# Patient Record
Sex: Male | Born: 1965 | Race: Black or African American | Hispanic: No | Smoking: Former smoker
Health system: Southern US, Community
[De-identification: ages and names within clinical notes are randomized; demographics above are authoritative.]

---

## 1998-10-10 ENCOUNTER — Emergency Department (HOSPITAL_COMMUNITY): Admission: EM | Admit: 1998-10-10 | Discharge: 1998-10-10 | Payer: Self-pay | Admitting: Emergency Medicine

## 2002-02-12 ENCOUNTER — Emergency Department (HOSPITAL_COMMUNITY): Admission: EM | Admit: 2002-02-12 | Discharge: 2002-02-12 | Payer: Self-pay | Admitting: Emergency Medicine

## 2002-05-10 ENCOUNTER — Emergency Department (HOSPITAL_COMMUNITY): Admission: EM | Admit: 2002-05-10 | Discharge: 2002-05-10 | Payer: Self-pay | Admitting: Emergency Medicine

## 2003-02-09 ENCOUNTER — Emergency Department (HOSPITAL_COMMUNITY): Admission: EM | Admit: 2003-02-09 | Discharge: 2003-02-09 | Payer: Self-pay | Admitting: Emergency Medicine

## 2004-03-15 ENCOUNTER — Emergency Department (HOSPITAL_COMMUNITY): Admission: EM | Admit: 2004-03-15 | Discharge: 2004-03-15 | Payer: Self-pay | Admitting: Family Medicine

## 2004-06-11 ENCOUNTER — Emergency Department (HOSPITAL_COMMUNITY): Admission: EM | Admit: 2004-06-11 | Discharge: 2004-06-11 | Payer: Self-pay | Admitting: Emergency Medicine

## 2006-12-03 ENCOUNTER — Emergency Department (HOSPITAL_COMMUNITY): Admission: EM | Admit: 2006-12-03 | Discharge: 2006-12-03 | Payer: Self-pay | Admitting: Emergency Medicine

## 2009-03-11 ENCOUNTER — Emergency Department (HOSPITAL_COMMUNITY): Admission: EM | Admit: 2009-03-11 | Discharge: 2009-03-11 | Payer: Self-pay | Admitting: Emergency Medicine

## 2010-09-07 ENCOUNTER — Emergency Department (HOSPITAL_COMMUNITY)
Admission: EM | Admit: 2010-09-07 | Discharge: 2010-09-07 | Disposition: A | Discharge status: Home / Self Care | Payer: 59 | Attending: Emergency Medicine | Admitting: Emergency Medicine

## 2010-09-07 ENCOUNTER — Emergency Department (HOSPITAL_COMMUNITY): Payer: 59

## 2010-09-07 DIAGNOSIS — R05 Cough: Secondary | ICD-10-CM | POA: Insufficient documentation

## 2010-09-07 DIAGNOSIS — R6889 Other general symptoms and signs: Secondary | ICD-10-CM | POA: Insufficient documentation

## 2010-09-07 DIAGNOSIS — R6883 Chills (without fever): Secondary | ICD-10-CM | POA: Insufficient documentation

## 2010-09-07 DIAGNOSIS — B9789 Other viral agents as the cause of diseases classified elsewhere: Secondary | ICD-10-CM | POA: Insufficient documentation

## 2010-09-07 DIAGNOSIS — R070 Pain in throat: Secondary | ICD-10-CM | POA: Insufficient documentation

## 2010-09-07 DIAGNOSIS — R0602 Shortness of breath: Secondary | ICD-10-CM | POA: Insufficient documentation

## 2010-09-07 DIAGNOSIS — R059 Cough, unspecified: Secondary | ICD-10-CM | POA: Insufficient documentation

## 2010-10-23 ENCOUNTER — Emergency Department (HOSPITAL_COMMUNITY): Payer: Self-pay

## 2010-10-23 ENCOUNTER — Emergency Department (HOSPITAL_COMMUNITY)
Admission: EM | Admit: 2010-10-23 | Discharge: 2010-10-23 | Disposition: A | Discharge status: Home / Self Care | Payer: Self-pay | Attending: Emergency Medicine | Admitting: Emergency Medicine

## 2010-10-23 DIAGNOSIS — K274 Chronic or unspecified peptic ulcer, site unspecified, with hemorrhage: Secondary | ICD-10-CM | POA: Insufficient documentation

## 2010-10-23 DIAGNOSIS — Z7982 Long term (current) use of aspirin: Secondary | ICD-10-CM | POA: Insufficient documentation

## 2010-10-23 DIAGNOSIS — R1012 Left upper quadrant pain: Secondary | ICD-10-CM | POA: Insufficient documentation

## 2010-10-23 DIAGNOSIS — K921 Melena: Secondary | ICD-10-CM | POA: Insufficient documentation

## 2010-10-23 LAB — POCT I-STAT, CHEM 8
Creatinine, Ser: 1.1 mg/dL (ref 0.50–1.35)
Potassium: 3.5 mEq/L (ref 3.5–5.1)
Sodium: 139 mEq/L (ref 135–145)
TCO2: 26 mmol/L (ref 0–100)

## 2010-10-23 LAB — URINALYSIS, ROUTINE W REFLEX MICROSCOPIC
Glucose, UA: NEGATIVE mg/dL
Hgb urine dipstick: NEGATIVE
Nitrite: NEGATIVE
Urobilinogen, UA: 0.2 mg/dL (ref 0.0–1.0)

## 2010-10-23 LAB — URINE MICROSCOPIC-ADD ON

## 2010-10-23 LAB — CBC
MCH: 32.4 pg (ref 26.0–34.0)
MCHC: 35.4 g/dL (ref 30.0–36.0)
Platelets: 252 10*3/uL (ref 150–400)
RBC: 4.79 MIL/uL (ref 4.22–5.81)

## 2010-10-23 LAB — COMPREHENSIVE METABOLIC PANEL
BUN: 9 mg/dL (ref 6–23)
CO2: 29 mEq/L (ref 19–32)
Creatinine, Ser: 1 mg/dL (ref 0.50–1.35)
Glucose, Bld: 92 mg/dL (ref 70–99)
Total Protein: 8.4 g/dL — ABNORMAL HIGH (ref 6.0–8.3)

## 2010-10-23 LAB — APTT: aPTT: 34 seconds (ref 24–37)

## 2010-10-23 LAB — PROTIME-INR: INR: 1.06 (ref 0.00–1.49)

## 2010-10-23 LAB — DIFFERENTIAL
Eosinophils Relative: 2 % (ref 0–5)
Monocytes Absolute: 0.9 10*3/uL (ref 0.1–1.0)
Neutro Abs: 4.4 10*3/uL (ref 1.7–7.7)

## 2011-06-27 ENCOUNTER — Emergency Department (HOSPITAL_COMMUNITY)
Admission: EM | Admit: 2011-06-27 | Discharge: 2011-06-27 | Disposition: A | Discharge status: Home / Self Care | Payer: 59 | Source: Home / Self Care

## 2011-06-27 ENCOUNTER — Encounter (HOSPITAL_COMMUNITY): Payer: Self-pay | Admitting: *Deleted

## 2011-06-27 ENCOUNTER — Emergency Department (HOSPITAL_COMMUNITY): Payer: 59

## 2011-06-27 ENCOUNTER — Emergency Department (HOSPITAL_COMMUNITY)
Admission: EM | Admit: 2011-06-27 | Discharge: 2011-06-27 | Disposition: A | Discharge status: Home / Self Care | Payer: 59 | Attending: Physician Assistant | Admitting: Physician Assistant

## 2011-06-27 DIAGNOSIS — S63259A Unspecified dislocation of unspecified finger, initial encounter: Secondary | ICD-10-CM

## 2011-06-27 DIAGNOSIS — X500XXA Overexertion from strenuous movement or load, initial encounter: Secondary | ICD-10-CM | POA: Insufficient documentation

## 2011-06-27 DIAGNOSIS — F17200 Nicotine dependence, unspecified, uncomplicated: Secondary | ICD-10-CM | POA: Insufficient documentation

## 2011-06-27 MED ORDER — LIDOCAINE HCL 2 % IJ SOLN
10.0000 mL | Freq: Once | INTRAMUSCULAR | Status: AC
  Administered 2011-06-27: 200 mg via INTRADERMAL
  Filled 2011-06-27: qty 1

## 2011-06-27 MED ORDER — IBUPROFEN 800 MG PO TABS: 800.0000 mg | ORAL_TABLET | Freq: Three times a day (TID) | ORAL | Status: AC

## 2011-06-27 MED ORDER — OXYCODONE-ACETAMINOPHEN 5-325 MG PO TABS: 1.0000 | ORAL_TABLET | ORAL | Status: AC | PRN

## 2011-06-27 NOTE — Discharge Instructions (Signed)
Mr Slabach your finger was dislocated and we reset this in the ER.  Wear the finger splint until you feel better.  If not better in several days follow up with Dr Mina Marble listed below.  Take ibuprofen 800mg  every 6 hours for the next 24 hours with food.  Take the percocet for severe pain but do not drive or operate machinery with this.  ICe and elevate intermittantly x 24 hours.   Finger Dislocation A dislocated finger means that the bones of the finger joint are out of place. Most of the time, finger dislocations can be put back in place without having surgery. If the joint is very unstable or if the bone is broken (fractured), it may require surgery. After surgery, the finger should be kept in a splint for 1 to 2 weeks to protect the joint. Following this, the injured finger may be taped to the finger next to it for another 2 weeks until all the pain and swelling are gone. You should begin gentle motion exercises of the joint in 1 week to keep the joint from getting stiff. Keep your hand raised and use ice packs often for 2 to 3 days. This will lessen the pain and swelling. Follow-up care within 1 week is very important. SEEK MEDICAL CARE IF:  You develop increasing pain.   Your injured finger becomes numb, cold, or pale.  Document Released: 01/15/2005 Document Revised: 09/27/2010 Document Reviewed: 07/06/2006 [facility] Patient Information 2012 [place], [place].

## 2011-06-27 NOTE — ED Provider Notes (Signed)
History     CSN: [id number]  Arrival date & time 06/27/11  1706   First MD Initiated Contact with Patient 06/27/11 1805      Chief Complaint  Patient presents with  . Finger Injury    (Consider location/radiation/quality/duration/timing/severity/associated sxs/prior treatment) Patient is a 46 y.o. male presenting with hand pain. The history is provided by the patient and a relative. No language interpreter was used.  Hand Pain This is a new problem. The current episode started today. The problem occurs constantly. The problem has been unchanged. Pertinent negatives include no fever, nausea or vomiting. The symptoms are aggravated by bending. He has tried nothing for the symptoms.   Patient reports that he was turning around to speak to a coworker and his and jammed into the wall  dislocating his left index distal DIP joint dorsally per x-ray. Good CMS to finger.    History reviewed. No pertinent past medical history.  History reviewed. No pertinent past surgical history.  History reviewed. No pertinent family history.  History  Substance Use Topics  . Smoking status: Current Everyday Smoker    Types: Cigarettes  . Smokeless tobacco: Not on file  . Alcohol Use: No      Review of Systems  Constitutional: Negative.  Negative for fever.  HENT: Negative.   Eyes: Negative.   Respiratory: Negative.   Cardiovascular: Negative.   Gastrointestinal: Negative.  Negative for nausea and vomiting.  Musculoskeletal:       L index finger pain  Neurological: Negative.   Psychiatric/Behavioral: Negative.   All other systems reviewed and are negative.    Allergies  Review of patient's allergies indicates no known allergies.  Home Medications   Current Outpatient Rx  Name Route Sig Dispense Refill  . IBUPROFEN 200 MG PO TABS Oral Take 200 mg by mouth every 6 (six) hours as needed. Pain      BP 110/66  Pulse 78  Temp(Src) 98.6 F (37 C) (Oral)  Resp 18  Ht 6' (1.829 m)   Wt 156 lb (70.761 kg)  BMI 21.16 kg/m2  SpO2 98%  Physical Exam  Nursing note and vitals reviewed. Constitutional: He is oriented to person, place, and time. He appears well-developed and well-nourished.  HENT:  Head: Normocephalic.  Eyes: Conjunctivae and EOM are normal. Pupils are equal, round, and reactive to light.  Neck: Normal range of motion. Neck supple.  Cardiovascular: Normal rate.   Pulmonary/Chest: Effort normal.  Abdominal: Soft.  Musculoskeletal: Normal range of motion.       L index finger defomity.    Neurological: He is alert and oriented to person, place, and time.  Skin: Skin is warm and dry.  Psychiatric: He has a normal mood and affect.    ED Course  Reduction of dislocation Date/Time: 06/27/2011 11:15 AM Performed by: Remi Haggard Authorized by: Remi Haggard Consent: Verbal consent obtained. Written consent not obtained. Risks and benefits: risks, benefits and alternatives were discussed Consent given by: patient Patient understanding: patient states understanding of the procedure being performed Patient identity confirmed: verbally with patient, arm band, provided demographic data and hospital-assigned identification number Time out: Immediately prior to procedure a "time out" was called to verify the correct patient, procedure, equipment, support staff and site/side marked as required. Local anesthesia used: no Patient sedated: no Comments: Digital block performed with 2% lidocaine to L index finger for reduction of dislocation   (including critical care time)  Labs Reviewed - No data to display Dg Finger Index  Left  06/27/2011  *RADIOLOGY REPORT*  Clinical Data: Trauma to left index finger.  Pain.  LEFT INDEX FINGER 2+V  Comparison: Left hand radiographs 03/11/2009  Findings: The DIP joint is located dorsally.  No definite fracture is evident.  There is slight radial displacement as well. Extensive soft tissue swelling is associated.  IMPRESSION:   1.  Dorsal dislocation of the DIP joint. 2.  Extensive soft tissue swelling.  Original Report Authenticated By: Jamesetta Orleans. MATTERN, M.D.     1. Dislocated finger       MDM  L index dislocation of DIP joint. Reduced in the ER with digital block.  Finger splint and rx for pain meds.  Follow up with ortho this week.  Patient ready for discharge.         Remi Haggard, NP 06/28/11 1326

## 2011-06-27 NOTE — ED Notes (Signed)
Pt states he "bent my finger all the way back." c/o increased pain with movement and swelling. No obvious deformity noted.

## 2011-06-27 NOTE — ED Notes (Signed)
To ED for eval of right first finger pain past having tip of finger hyperextended pta. Pt with decreased movement to finger now.

## 2011-06-30 NOTE — ED Provider Notes (Signed)
Medical screening examination/treatment/procedure(s) were performed by non-physician practitioner and as supervising physician I was immediately available for consultation/collaboration.  Faryn Sieg T Caci Orren, MD 06/30/11 1507 

## 2011-11-08 ENCOUNTER — Emergency Department (HOSPITAL_COMMUNITY)
Admission: EM | Admit: 2011-11-08 | Discharge: 2011-11-08 | Disposition: A | Discharge status: Home / Self Care | Payer: Self-pay | Attending: Emergency Medicine | Admitting: Emergency Medicine

## 2011-11-08 ENCOUNTER — Encounter (HOSPITAL_COMMUNITY): Payer: Self-pay | Admitting: *Deleted

## 2011-11-08 DIAGNOSIS — Z87891 Personal history of nicotine dependence: Secondary | ICD-10-CM | POA: Insufficient documentation

## 2011-11-08 DIAGNOSIS — N342 Other urethritis: Secondary | ICD-10-CM | POA: Insufficient documentation

## 2011-11-08 MED ORDER — AZITHROMYCIN 250 MG PO TABS
1000.0000 mg | ORAL_TABLET | Freq: Once | ORAL | Status: AC
  Administered 2011-11-08: 1000 mg via ORAL
  Filled 2011-11-08: qty 4

## 2011-11-08 MED ORDER — CEFTRIAXONE SODIUM 250 MG IJ SOLR
250.0000 mg | Freq: Once | INTRAMUSCULAR | Status: AC
  Administered 2011-11-08: 250 mg via INTRAMUSCULAR
  Filled 2011-11-08: qty 250

## 2011-11-08 MED ORDER — LIDOCAINE HCL 1 % IJ SOLN
INTRAMUSCULAR | Status: AC
  Administered 2011-11-08: 20 mL
  Filled 2011-11-08: qty 20

## 2011-11-08 NOTE — ED Notes (Addendum)
Pt from home with reports of stomach growling early in the morning as well as slight nausea that started 2 days ago. Pt also reports that when voiding, has some dribbling after feels as if he has emptied his bladder. Pt denies abdominal pain, dysuria or fever. Pt reports that he recently got back together with his X-wife and wants to be checked for STD. Pt also denies penile pain or discharge.

## 2011-11-08 NOTE — ED Provider Notes (Signed)
History     CSN: [id number] Arrival date & time 11/08/11  1555 First MD Initiated Contact with Patient 11/08/11 1826    Chief Complaint  Patient presents with  . Nausea   HPI Pt states he wants to make sure he does not have an STD.  He just got back together with his wife.  When he urinated the other day the stream was split.  He thinks he may have noticed some discharge.  He has had some nausea at times and is anxious about having caught something because he recently reunited with his x wife.  No fever, no vomiting.  No pain. History reviewed. No pertinent past medical history.  History reviewed. No pertinent past surgical history.  History reviewed. No pertinent family history.  History  Substance Use Topics  . Smoking status: Former Smoker -- 0.5 packs/day    Quit date: 09/08/2011  . Smokeless tobacco: Never Used  . Alcohol Use: No      Review of Systems  All other systems reviewed and are negative.    Allergies  Review of patient's allergies indicates no known allergies.  Home Medications   Current Outpatient Rx  Name Route Sig Dispense Refill  . ADULT MULTIVITAMIN W/MINERALS CH Oral Take 1 tablet by mouth daily.      BP 132/82  Pulse 62  Temp 98 F (36.7 C) (Oral)  Resp 18  SpO2 100%  Physical Exam  Nursing note and vitals reviewed. Constitutional: He appears well-developed and well-nourished. No distress.  HENT:  Head: Normocephalic and atraumatic.  Right Ear: External ear normal.  Left Ear: External ear normal.  Eyes: Conjunctivae normal are normal. Right eye exhibits no discharge. Left eye exhibits no discharge. No scleral icterus.  Neck: Neck supple. No tracheal deviation present.  Cardiovascular: Normal rate, regular rhythm and intact distal pulses.   Pulmonary/Chest: Effort normal and breath sounds normal. No stridor. No respiratory distress. He has no wheezes. He has no rales.  Abdominal: Soft. Bowel sounds are normal. He exhibits no  distension. There is no tenderness. There is no rebound and no guarding.  Genitourinary: Penis normal.  Musculoskeletal: He exhibits no edema and no tenderness.  Neurological: He is alert. He has normal strength. No sensory deficit. Cranial nerve deficit:  no gross defecits noted. He exhibits normal muscle tone. He displays no seizure activity. Coordination normal.  Skin: Skin is warm and dry. No rash noted.  Psychiatric: He has a normal mood and affect.    ED Course  Procedures (including critical care time)   Labs Reviewed  GC/CHLAMYDIA PROBE AMP, GENITAL   No results found.   1. Urethritis       MDM  Patient is concerned about STD exposure.  GC and Chlamydia tests were sent off. He was not interested in HIV testing. Patient was empirically treated for gonorrhea and Chlamydia        Celene Kras, MD 11/08/11 [phone]

## 2011-11-09 LAB — GC/CHLAMYDIA PROBE AMP, GENITAL
Chlamydia, DNA Probe: NEGATIVE
GC Probe Amp, Genital: NEGATIVE

## 2017-02-26 ENCOUNTER — Encounter (HOSPITAL_COMMUNITY): Payer: Self-pay | Admitting: *Deleted

## 2017-02-26 ENCOUNTER — Emergency Department (HOSPITAL_COMMUNITY): Payer: Self-pay

## 2017-02-26 ENCOUNTER — Emergency Department (HOSPITAL_COMMUNITY)
Admission: EM | Admit: 2017-02-26 | Discharge: 2017-02-26 | Disposition: A | Discharge status: Home / Self Care | Payer: Self-pay | Attending: Emergency Medicine | Admitting: Emergency Medicine

## 2017-02-26 ENCOUNTER — Other Ambulatory Visit: Payer: Self-pay

## 2017-02-26 DIAGNOSIS — R079 Chest pain, unspecified: Secondary | ICD-10-CM | POA: Insufficient documentation

## 2017-02-26 DIAGNOSIS — R05 Cough: Secondary | ICD-10-CM | POA: Insufficient documentation

## 2017-02-26 DIAGNOSIS — Z5321 Procedure and treatment not carried out due to patient leaving prior to being seen by health care provider: Secondary | ICD-10-CM | POA: Insufficient documentation

## 2017-02-26 DIAGNOSIS — R059 Cough, unspecified: Secondary | ICD-10-CM

## 2017-02-26 LAB — BASIC METABOLIC PANEL
ANION GAP: 13 (ref 5–15)
BUN: 13 mg/dL (ref 6–20)
CALCIUM: 9 mg/dL (ref 8.9–10.3)
CO2: 23 mmol/L (ref 22–32)
Chloride: 97 mmol/L — ABNORMAL LOW (ref 101–111)
Creatinine, Ser: 1.06 mg/dL (ref 0.61–1.24)
Glucose, Bld: 97 mg/dL (ref 65–99)
POTASSIUM: 3.7 mmol/L (ref 3.5–5.1)
SODIUM: 133 mmol/L — AB (ref 135–145)

## 2017-02-26 LAB — CBC
HEMATOCRIT: 44 % (ref 39.0–52.0)
HEMOGLOBIN: 15.3 g/dL (ref 13.0–17.0)
MCH: 31.9 pg (ref 26.0–34.0)
MCHC: 34.8 g/dL (ref 30.0–36.0)
MCV: 91.7 fL (ref 78.0–100.0)
Platelets: 180 10*3/uL (ref 150–400)
RBC: 4.8 MIL/uL (ref 4.22–5.81)
RDW: 13.8 % (ref 11.5–15.5)
WBC: 4.8 10*3/uL (ref 4.0–10.5)

## 2017-02-26 LAB — I-STAT TROPONIN, ED: TROPONIN I, POC: 0 ng/mL (ref 0.00–0.08)

## 2017-02-26 NOTE — ED Triage Notes (Signed)
4 days ago started having cough and congestion.  Pressure to chest improves pain.  Reported fever, cold, and some diarrhea.

## 2017-02-26 NOTE — ED Notes (Signed)
Pt stated that he wanted to know how long it will be before he go back. I explained to the pt about the delay. Pt stated that he might walk out.

## 2017-02-26 NOTE — ED Notes (Addendum)
Patient asking about wait time; explained process and asked him to wait a bit longer. Patient agreed at this time. Results reviewed at this time.

## 2017-02-26 NOTE — ED Notes (Signed)
Pt walked out pt stated to me that he can not wait any longer and walked away from me and out the door

## 2017-02-26 NOTE — ED Notes (Signed)
Called patient for vitals recheck, no answer-see previous note.

## 2017-02-26 NOTE — ED Provider Notes (Cosign Needed)
Patient placed in Quick Look pathway, seen and evaluated   Chief Complaint: Cough  HPI:   52 year old male presents today with complaints of chest pain and cough.  Patient notes 4 days ago developed upper respiratory congestion including cough, generalized chest pain.  He notes he has some pain over the left anterior aspect of his chest which is improved with putting pressure on the chest wall.  She denies any cardiac history, he reports subjective fevers at home.  He also reports diarrhea.  ROS: Cough (one)  Physical Exam:   Vitals:   02/26/17 1413  BP: (!) 145/78  Pulse: 100  Resp: 18  Temp: 98.3 F (36.8 C)  SpO2: 99%     Gen: No distress  Neuro: Awake and Alert  Skin: Warm    Focused Exam: Lung sounds clear bilateral   Initiation of care has begun. The patient has been counseled on the process, plan, and necessity for staying for the completion/evaluation, and the remainder of the medical screening examination    Eyvonne MechanicHedges, Asako Saliba, PA-C 02/26/17 1425

## 2019-10-28 IMAGING — DX DG CHEST 2V
2 series · 2 of 2 positions shown · non-contrast
Comparison: Chest x-ray of September 07, 2010

CLINICAL DATA: Flu like symptoms, intermittent cough, nausea, and
fever. Former smoker.

EXAM:
CHEST  2 VIEW

[chest pa]
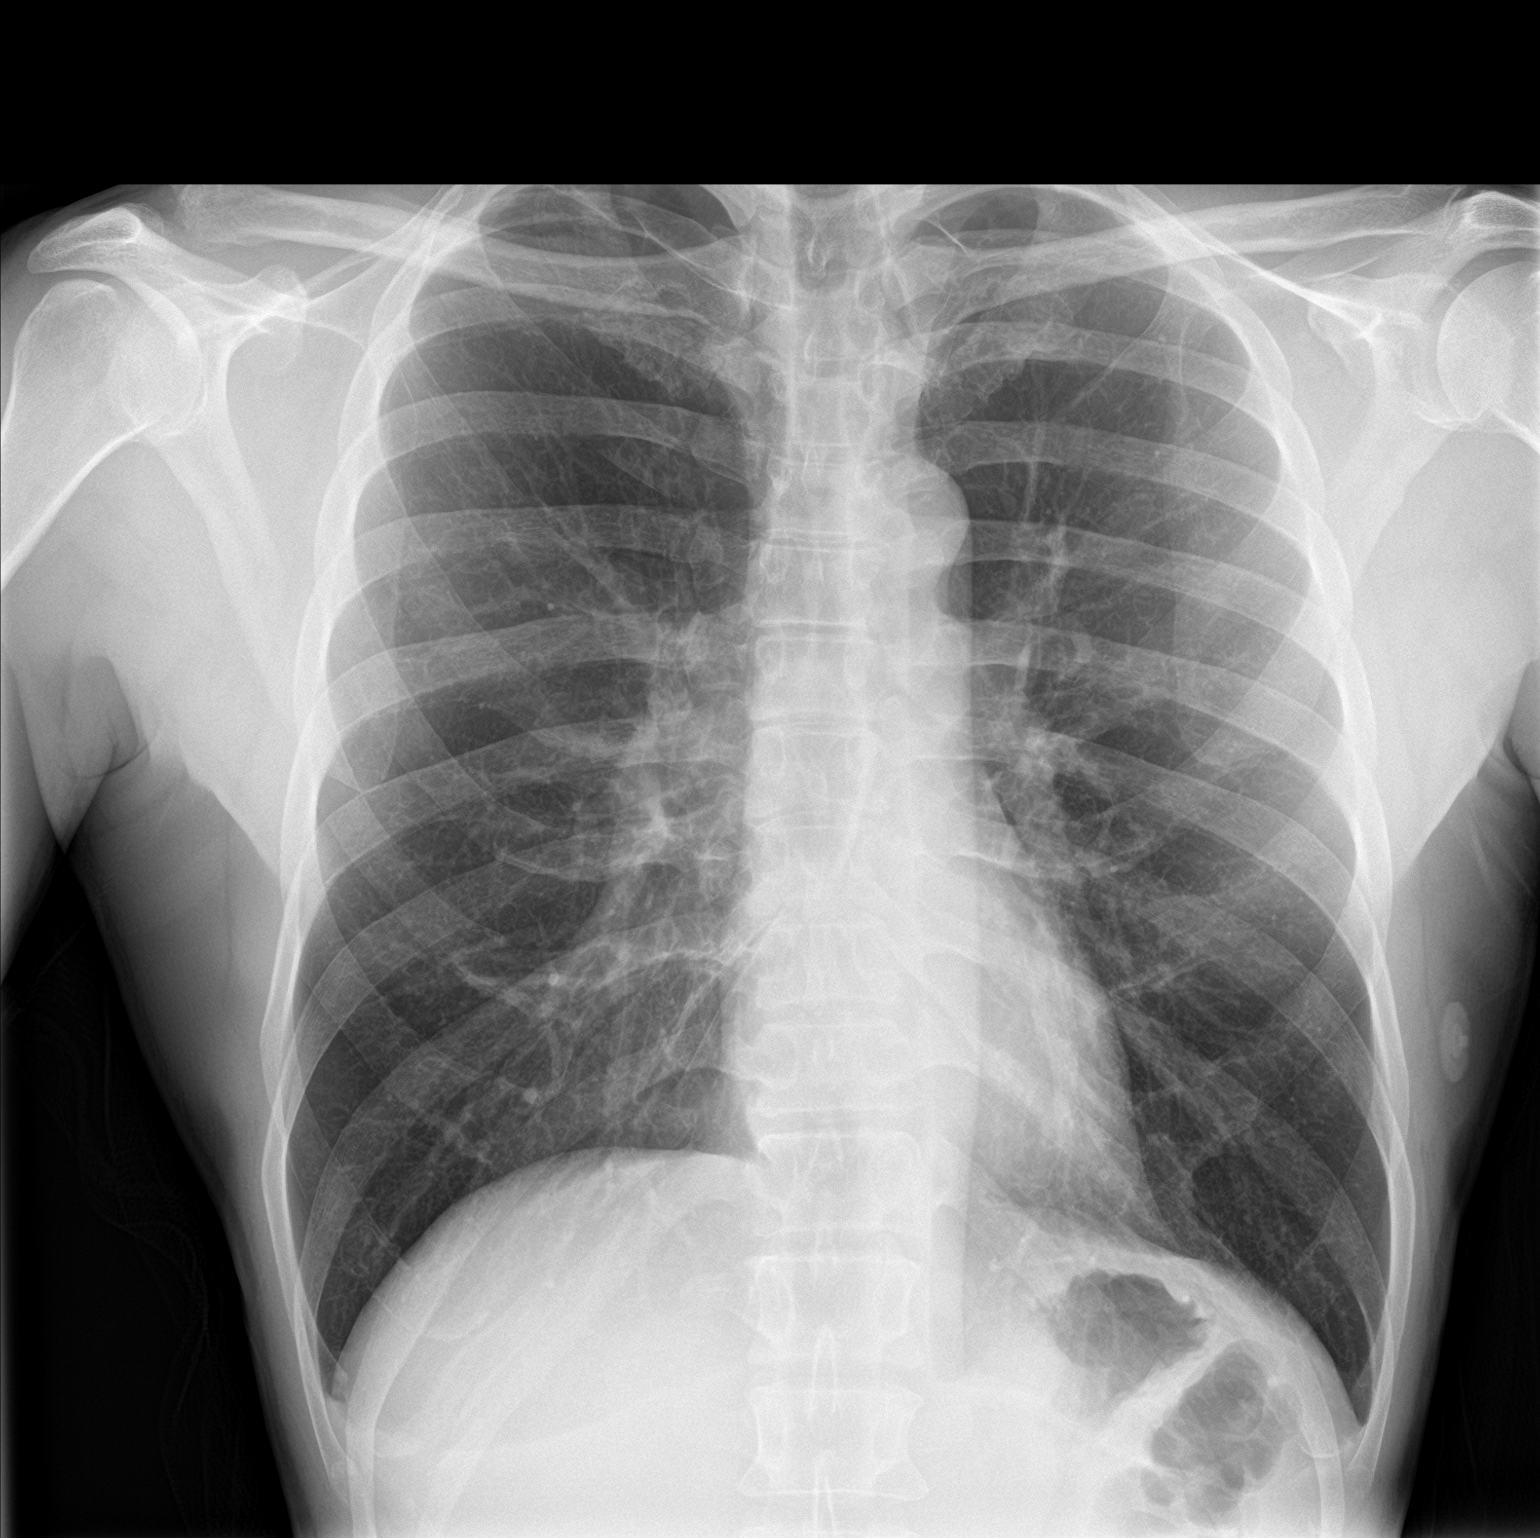

[chest lat]
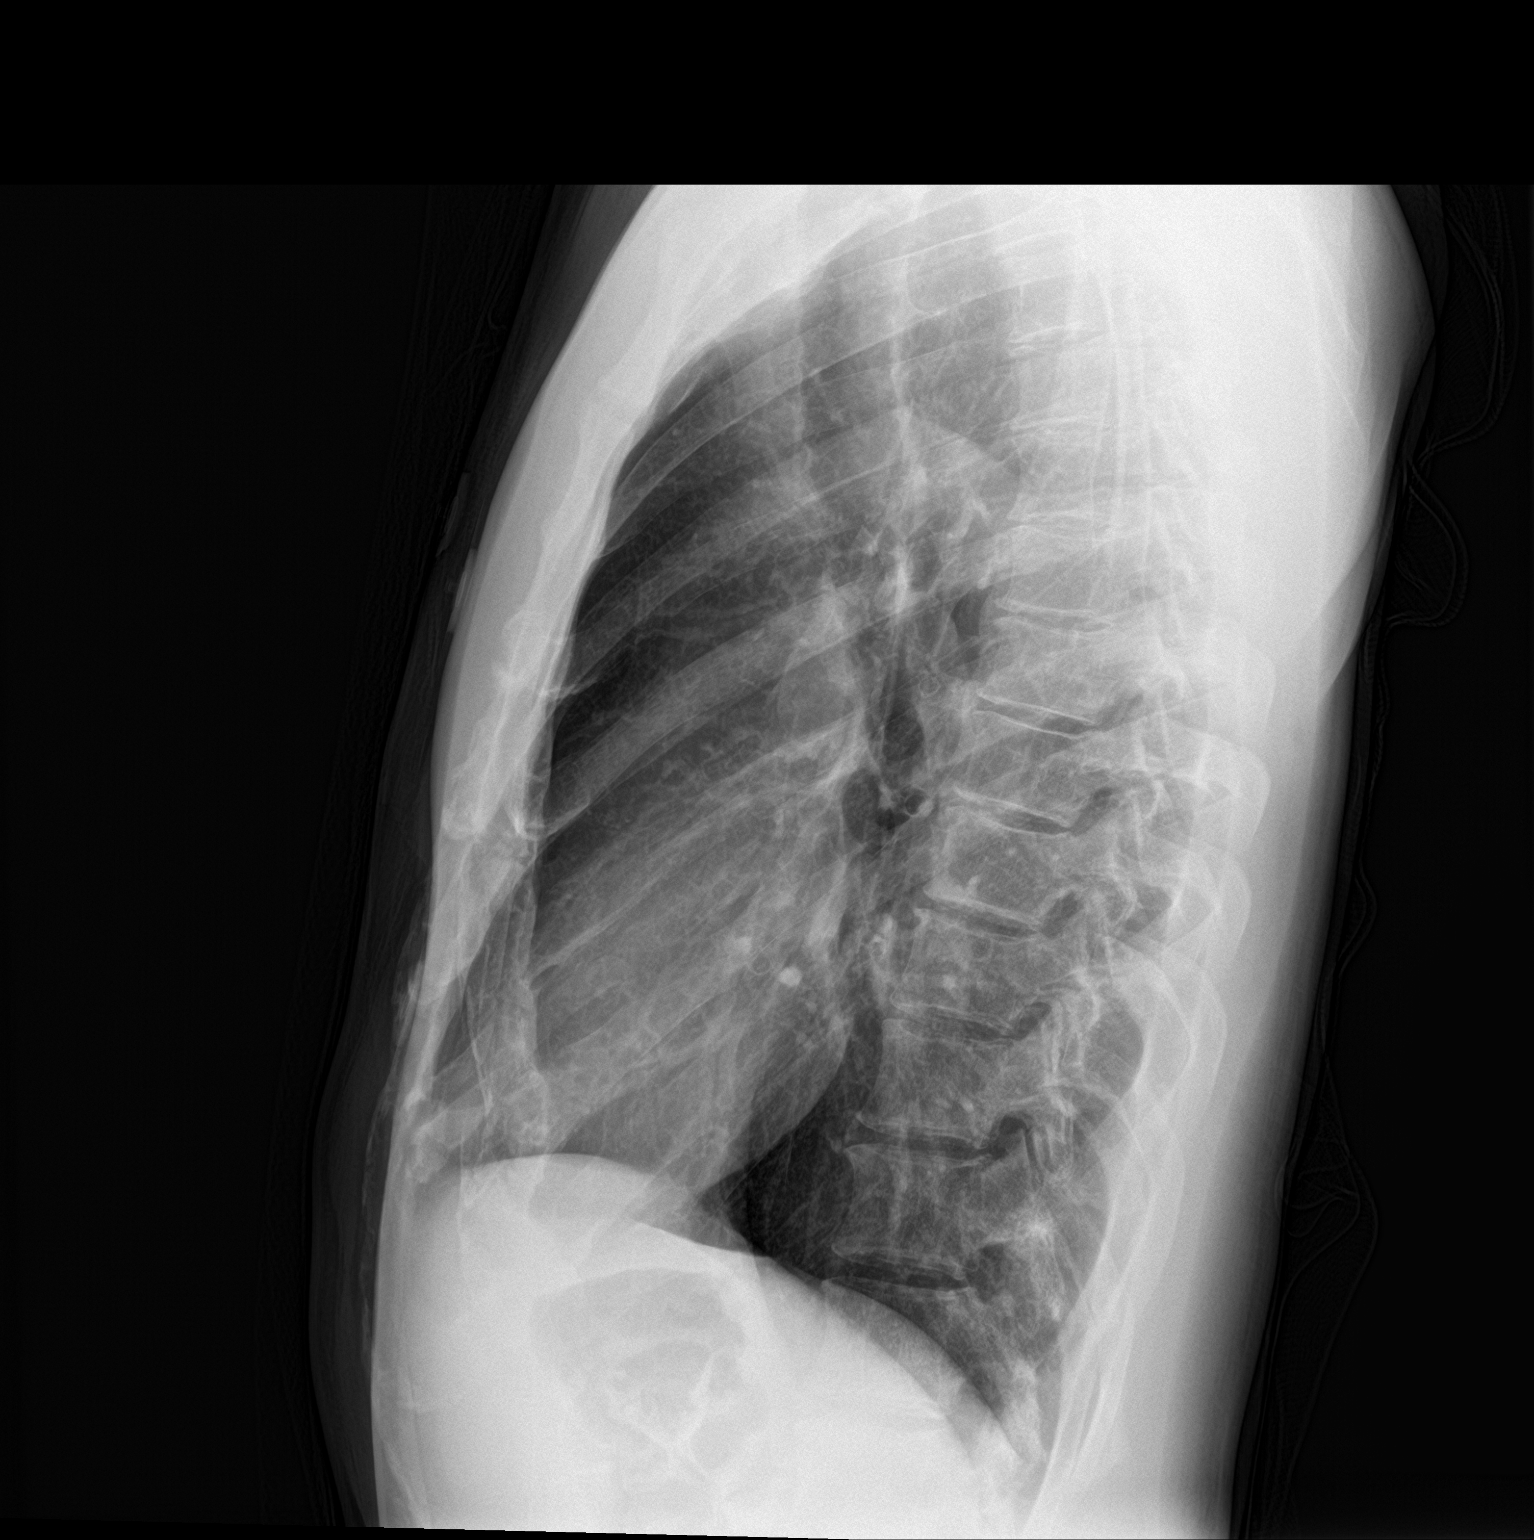

[2 of 2 positions shown; findings below may reference images not displayed]

FINDINGS: The lungs are mildly hyperinflated. There is no focal infiltrate.
There is no pleural effusion. There is stable scarring and bullous
change in the apices. The heart and pulmonary vascularity are
normal. The mediastinum is normal in width. The bony thorax exhibits
no acute abnormality.
IMPRESSION: Chronic bronchitic-smoking related changes. No acute cardiopulmonary
abnormality.
